# Patient Record
Sex: Female | Born: 1935 | Race: White | Hispanic: No | Smoking: Former smoker
Health system: Southern US, Community
[De-identification: ages and names within clinical notes are randomized; demographics above are authoritative.]

## PROBLEM LIST (undated history)

## (undated) DIAGNOSIS — E785 Hyperlipidemia, unspecified: Secondary | ICD-10-CM

## (undated) DIAGNOSIS — I10 Essential (primary) hypertension: Secondary | ICD-10-CM

## (undated) DIAGNOSIS — I7090 Unspecified atherosclerosis: Secondary | ICD-10-CM

## (undated) DIAGNOSIS — F0390 Unspecified dementia without behavioral disturbance: Secondary | ICD-10-CM

## (undated) DIAGNOSIS — I2510 Atherosclerotic heart disease of native coronary artery without angina pectoris: Secondary | ICD-10-CM

## (undated) DIAGNOSIS — M1990 Unspecified osteoarthritis, unspecified site: Secondary | ICD-10-CM

## (undated) HISTORY — DX: Unspecified osteoarthritis, unspecified site: M19.90

## (undated) HISTORY — DX: Unspecified dementia, unspecified severity, without behavioral disturbance, psychotic disturbance, mood disturbance, and anxiety: F03.90

## (undated) HISTORY — DX: Hyperlipidemia, unspecified: E78.5

## (undated) HISTORY — PX: ABDOMINAL HYSTERECTOMY: SHX81

## (undated) HISTORY — DX: Essential (primary) hypertension: I10

## (undated) HISTORY — DX: Unspecified atherosclerosis: I70.90

## (undated) HISTORY — DX: Atherosclerotic heart disease of native coronary artery without angina pectoris: I25.10

---

## 2009-03-11 ENCOUNTER — Emergency Department (HOSPITAL_BASED_OUTPATIENT_CLINIC_OR_DEPARTMENT_OTHER): Admission: EM | Admit: 2009-03-11 | Discharge: 2009-03-11 | Payer: Self-pay | Admitting: Emergency Medicine

## 2011-06-19 LAB — HM MAMMOGRAPHY

## 2012-08-11 ENCOUNTER — Other Ambulatory Visit (HOSPITAL_BASED_OUTPATIENT_CLINIC_OR_DEPARTMENT_OTHER): Payer: Self-pay | Admitting: Family Medicine

## 2012-08-13 ENCOUNTER — Ambulatory Visit (HOSPITAL_BASED_OUTPATIENT_CLINIC_OR_DEPARTMENT_OTHER)
Admission: RE | Admit: 2012-08-13 | Discharge: 2012-08-13 | Disposition: A | Discharge status: Home / Self Care | Payer: Medicare Other | Source: Ambulatory Visit | Attending: Family Medicine | Admitting: Family Medicine

## 2012-08-13 DIAGNOSIS — I7090 Unspecified atherosclerosis: Secondary | ICD-10-CM

## 2012-08-13 DIAGNOSIS — I6529 Occlusion and stenosis of unspecified carotid artery: Secondary | ICD-10-CM | POA: Insufficient documentation

## 2013-01-05 ENCOUNTER — Other Ambulatory Visit: Payer: Self-pay | Admitting: *Deleted

## 2013-01-05 DIAGNOSIS — E785 Hyperlipidemia, unspecified: Secondary | ICD-10-CM

## 2013-01-05 DIAGNOSIS — E559 Vitamin D deficiency, unspecified: Secondary | ICD-10-CM

## 2013-01-05 DIAGNOSIS — R5383 Other fatigue: Secondary | ICD-10-CM

## 2013-01-06 ENCOUNTER — Other Ambulatory Visit: Payer: Self-pay

## 2013-01-06 LAB — COMPLETE METABOLIC PANEL WITH GFR
ALT: 18 U/L (ref 0–35)
AST: 24 U/L (ref 0–37)
Albumin: 4.4 g/dL (ref 3.5–5.2)
Alkaline Phosphatase: 62 U/L (ref 39–117)
BUN: 15 mg/dL (ref 6–23)
CO2: 28 mEq/L (ref 19–32)
Calcium: 9.8 mg/dL (ref 8.4–10.5)
Chloride: 107 mEq/L (ref 96–112)
Creat: 0.84 mg/dL (ref 0.50–1.10)
GFR, Est African American: 76 mL/min
GFR, Est Non African American: 66 mL/min
Glucose, Bld: 100 mg/dL — ABNORMAL HIGH (ref 70–99)
Potassium: 4.2 mEq/L (ref 3.5–5.3)
Sodium: 143 mEq/L (ref 135–145)
Total Bilirubin: 0.6 mg/dL (ref 0.3–1.2)
Total Protein: 7 g/dL (ref 6.0–8.3)

## 2013-01-06 LAB — CBC WITH DIFFERENTIAL/PLATELET
Basophils Absolute: 0 10*3/uL (ref 0.0–0.1)
Basophils Relative: 0 % (ref 0–1)
Eosinophils Absolute: 0.1 10*3/uL (ref 0.0–0.7)
Eosinophils Relative: 2 % (ref 0–5)
HCT: 40.2 % (ref 36.0–46.0)
Hemoglobin: 13.7 g/dL (ref 12.0–15.0)
Lymphocytes Relative: 18 % (ref 12–46)
Lymphs Abs: 1.1 10*3/uL (ref 0.7–4.0)
MCH: 31.9 pg (ref 26.0–34.0)
MCHC: 34.1 g/dL (ref 30.0–36.0)
MCV: 93.5 fL (ref 78.0–100.0)
Monocytes Absolute: 0.3 10*3/uL (ref 0.1–1.0)
Monocytes Relative: 5 % (ref 3–12)
Neutro Abs: 4.6 10*3/uL (ref 1.7–7.7)
Neutrophils Relative %: 75 % (ref 43–77)
Platelets: 233 10*3/uL (ref 150–400)
RBC: 4.3 MIL/uL (ref 3.87–5.11)
RDW: 13.5 % (ref 11.5–15.5)
WBC: 6.1 10*3/uL (ref 4.0–10.5)

## 2013-01-06 LAB — LIPID PANEL
Cholesterol: 153 mg/dL (ref 0–200)
HDL: 68 mg/dL (ref >39)
LDL Cholesterol: 65 mg/dL (ref 0–99)
Total CHOL/HDL Ratio: 2.3 Ratio
Triglycerides: 102 mg/dL (ref <150)
VLDL: 20 mg/dL (ref 0–40)

## 2013-01-06 LAB — TSH: TSH: 1.298 u[IU]/mL (ref 0.350–4.500)

## 2013-01-07 LAB — VITAMIN D 25 HYDROXY (VIT D DEFICIENCY, FRACTURES): Vit D, 25-Hydroxy: 43 ng/mL (ref 30–89)

## 2013-01-29 ENCOUNTER — Ambulatory Visit: Payer: Self-pay | Admitting: Family Medicine

## 2013-01-29 ENCOUNTER — Encounter: Payer: Self-pay | Admitting: Family Medicine

## 2013-01-29 ENCOUNTER — Ambulatory Visit (INDEPENDENT_AMBULATORY_CARE_PROVIDER_SITE_OTHER): Payer: Medicare Other | Admitting: Family Medicine

## 2013-01-29 VITALS — BP 137/73 | HR 58 | Resp 16 | Ht 58.5 in | Wt 111.0 lb

## 2013-01-29 DIAGNOSIS — E785 Hyperlipidemia, unspecified: Secondary | ICD-10-CM

## 2013-01-29 DIAGNOSIS — I10 Essential (primary) hypertension: Secondary | ICD-10-CM

## 2013-01-29 DIAGNOSIS — R413 Other amnesia: Secondary | ICD-10-CM

## 2013-01-29 DIAGNOSIS — G4700 Insomnia, unspecified: Secondary | ICD-10-CM

## 2013-01-29 MED ORDER — ROSUVASTATIN CALCIUM 20 MG PO TABS: 20.0000 mg | ORAL_TABLET | Freq: Every day | ORAL | Status: DC

## 2013-01-29 MED ORDER — TRAZODONE HCL 50 MG PO TABS: ORAL_TABLET | ORAL | Status: DC

## 2013-01-29 NOTE — Progress Notes (Signed)
  Subjective:    Patient ID: Alexandra Lawson, female    DOB: Sep 16, 1930, 79 y.o.   MRN: [id number]  HPI  Alexandra Lawson is here today with her daughter Vic Blackbird)  to go over her most recent lab results and discuss the condition listed below:   1)  Hyperlipidemia:  She is doing fine on Crestor and needs to have it refilled.   2)  Memory:  Her daughter is becoming more concerned with her memory.     Review of Systems  Constitutional: Negative.   HENT: Negative.   Eyes: Negative.   Respiratory: Negative.   Cardiovascular: Negative.   Gastrointestinal: Negative.   Endocrine: Negative.   Genitourinary: Negative.   Allergic/Immunologic: Negative.   Neurological: Negative.   Hematological: Negative.   Psychiatric/Behavioral: Negative.     Past Medical History  Diagnosis Date  . Generalized and unspecified atherosclerosis   . Hyperlipidemia   . Hypertension   . Arthritis   . CAD (coronary artery disease)     Family History  Problem Relation Age of Onset  . Cancer Mother     Ovarian Cancer  . Heart disease Father     History   Social History Narrative   Marital Status:  Widowed    Children:  Daughter Technical sales engineer)    Pets:  Dog (1)    Living Situation: Lives alone    Occupation:  Retired    Education: 8 th grade    Tobacco Use/Exposure:  Former Smoker.  She smoked about 3 cigarettes per day for 40 years.  She quit 20 years ago.       Alcohol Use:  Never    Drug Use:  None   Diet:  Regular   Exercise:  She keeps herself busy with her dog and she takes care of her own yard.     Hobbies:  Watching TV                        Objective:   Physical Exam  Vitals reviewed. Constitutional: She is oriented to person, place, and time.  Eyes: Conjunctivae are normal. No scleral icterus.  Neck: Neck supple. No thyromegaly present.  Cardiovascular: Normal rate, regular rhythm and normal heart sounds.   Pulmonary/Chest: Effort normal and breath sounds normal.   Musculoskeletal: She exhibits no edema and no tenderness.  Lymphadenopathy:    She has no cervical adenopathy.  Neurological: She is alert and oriented to person, place, and time.  Skin: Skin is warm and dry.  Psychiatric: She has a normal mood and affect. Her behavior is normal.  She appears to have some dementia.      Assessment & Plan:

## 2013-01-29 NOTE — Patient Instructions (Addendum)
1) Cholesterol - Your level was perfect on the 10 mg so .Marland Kitchen..let's have you stay on this dosge.  You will take 1/2 of the 20 mg per day.  We'll recheck your labs in 6 months.  We'll recheck your carotid doppler early March of next year.    2)  Insomnia - Take the trazodone as needed.  3)  Blood Pressure - Continue on the metoprolol.   Dementia Dementia is a general term for problems with brain function. A person with dementia has memory loss and a hard time with at least one other brain function such as thinking, speaking, or problem solving. Dementia can affect social functioning, how you do your job, your mood, or your personality. The changes may be hidden for a long time. The earliest forms of this disease are usually not detected by family or friends. Dementia can be:  Irreversible.  Potentially reversible.  Partially reversible.  Progressive. This means it can get worse over time. CAUSES  Irreversible dementia causes may include:  Degeneration of brain cells (Alzheimer's disease or lewy body dementia).  Multiple small strokes (vascular dementia).  Infection (chronic meningitis or Creutzfelt-Jakob disease).  Frontotemporal dementia. This affects younger people, age 40 to 70, compared to those who have Alzheimer's disease.  Dementia associated with other disorders like Parkinson's disease, Huntington's disease, or HIV-associated dementia. Potentially or partially reversible dementia causes may include:  Medicines.  Metabolic causes such as excessive alcohol intake, vitamin B12 deficiency, or thyroid disease.  Masses or pressure in the brain such as a tumor, blood clot, or hydrocephalus. SYMPTOMS  Symptoms are often hard to detect. Family members or coworkers may not notice them early in the disease process. Different people with dementia may have different symptoms. Symptoms can include:  A hard time with memory, especially recent memory. Long-term memory may not be  impaired.  Asking the same question multiple times or forgetting something someone just said.  A hard time speaking your thoughts or finding certain words.  A hard time solving problems or performing familiar tasks (such as how to use a telephone).  Sudden changes in mood.  Changes in personality, especially increasing moodiness or mistrust.  Depression.  A hard time understanding complex ideas that were never a problem in the past. DIAGNOSIS  There are no specific tests for dementia.   Your caregiver may recommend a thorough evaluation. This is because some forms of dementia can be reversible. The evaluation will likely include a physical exam and getting a detailed history from you and a family member. The history often gives the best clues and suggestions for a diagnosis.  Memory testing may be done. A detailed brain function evaluation called neuropsychologic testing may be helpful.  Lab tests and brain imaging (such as a CT scan or MRI scan) are sometimes important.  Sometimes observation and re-evaluation over time is very helpful. TREATMENT  Treatment depends on the cause.   If the problem is a vitamin deficiency, it may be helped or cured with supplements.  For dementias such as Alzheimer's disease, medicines are available to stabilize or slow the course of the disease. There are no cures for this type of dementia.  Your caregiver can help direct you to groups, organizations, and other caregivers to help with decisions in the care of you or your loved one. HOME CARE INSTRUCTIONS The care of individuals with dementia is varied and dependent upon the progression of the dementia. The following suggestions are intended for the person living with,  or caring for, the person with dementia.  Create a safe environment.  Remove the locks on bathroom doors to prevent the person from accidentally locking himself or herself in.  Use childproof latches on kitchen cabinets and any  place where cleaning supplies, chemicals, or alcohol are kept.  Use childproof covers in unused electrical outlets.  Install childproof devices to keep doors and windows secured.  Remove stove knobs or install safety knobs and an automatic shut-off on the stove.  Lower the temperature on water heaters.  Label medicines and keep them locked up.  Secure knives, lighters, matches, power tools, and guns, and keep these items out of reach.  Keep the house free from clutter. Remove rugs or anything that might contribute to a fall.  Remove objects that might break and hurt the person.  Make sure lighting is good, both inside and outside.  Install grab rails as needed.  Use a monitoring device to alert you to falls or other needs for help.  Reduce confusion.  Keep familiar objects and people around.  Use night lights or dim lights at night.  Label items or areas.  Use reminders, notes, or directions for daily activities or tasks.  Keep a simple, consistent routine for waking, meals, bathing, dressing, and bedtime.  Create a calm, quiet environment.  Place large clocks and calendars prominently.  Display emergency numbers and home address near all telephones.  Use cues to establish different times of the day. An example is to open curtains to let the natural light in during the day.   Use effective communication.  Choose simple words and short sentences.  Use a gentle, calm tone of voice.  Be careful not to interrupt.  If the person is struggling to find a word or communicate a thought, try to provide the word or thought.  Ask one question at a time. Allow the person ample time to answer questions. Repeat the question again if the person does not respond.  Reduce nighttime restlessness.  Provide a comfortable bed.  Have a consistent nighttime routine.  Ensure a regular walking or physical activity schedule. Involve the person in daily activities as much as  possible.  Limit napping during the day.  Limit caffeine.  Attend social events that stimulate rather than overwhelm the senses.  Encourage good nutrition and hydration.  Reduce distractions during meal times and snacks.  Avoid foods that are too hot or too cold.  Monitor chewing and swallowing ability.  Continue with routine vision, hearing, dental, and medical screenings.  Only give over-the-counter or prescription medicines as directed by the caregiver.  Monitor driving abilities. Do not allow the person to drive when safe driving is no longer possible.  Register with an identification program which could provide location assistance in the event of a missing person situation. SEEK MEDICAL CARE IF:   New behavioral problems start such as moodiness, aggressiveness, or seeing things that are not there (hallucinations).  Any new problem with brain function happens. This includes problems with balance, speech, or falling a lot.  Problems with swallowing develop.  Any symptoms of other illness happen. Small changes or worsening in any aspect of brain function can be a sign that the illness is getting worse. It can also be a sign of another medical illness such as infection. Seeing a caregiver right away is important. SEEK IMMEDIATE MEDICAL CARE IF:   A fever develops.  New or worsened confusion develops.  New or worsened sleepiness develops.  Staying awake  becomes hard to do. Document Released: 11/28/2000 Document Revised: 08/27/2011 Document Reviewed: 10/30/2010 [facility] Patient Information 2014 [place], [place].

## 2013-03-04 ENCOUNTER — Ambulatory Visit: Payer: Medicare Other

## 2013-03-11 ENCOUNTER — Ambulatory Visit (INDEPENDENT_AMBULATORY_CARE_PROVIDER_SITE_OTHER): Payer: Medicare Other | Admitting: *Deleted

## 2013-03-11 DIAGNOSIS — Z23 Encounter for immunization: Secondary | ICD-10-CM

## 2013-03-15 ENCOUNTER — Encounter: Payer: Self-pay | Admitting: Family Medicine

## 2013-03-15 DIAGNOSIS — F0390 Unspecified dementia without behavioral disturbance: Secondary | ICD-10-CM | POA: Insufficient documentation

## 2013-03-15 DIAGNOSIS — G4700 Insomnia, unspecified: Secondary | ICD-10-CM | POA: Insufficient documentation

## 2013-03-15 DIAGNOSIS — E785 Hyperlipidemia, unspecified: Secondary | ICD-10-CM | POA: Insufficient documentation

## 2013-03-15 DIAGNOSIS — I10 Essential (primary) hypertension: Secondary | ICD-10-CM | POA: Insufficient documentation

## 2013-03-15 DIAGNOSIS — R413 Other amnesia: Secondary | ICD-10-CM | POA: Insufficient documentation

## 2013-03-15 NOTE — Assessment & Plan Note (Deleted)
Alexandra Lawson has made an appointment for [facility] with a neurologist to evaluate her for dementia.

## 2013-03-15 NOTE — Assessment & Plan Note (Signed)
She will remain on Trazodone.

## 2013-03-15 NOTE — Assessment & Plan Note (Signed)
[name] has made an appointment for Alexandra Lawson with a neurologist to evaluate her for dementia.   

## 2013-03-15 NOTE — Assessment & Plan Note (Signed)
She will remain on Toprol XL 25 mg.

## 2013-03-15 NOTE — Assessment & Plan Note (Signed)
She is to take 10 mg of Crestor daily (1/2 of the 20 mg).

## 2013-03-15 NOTE — Assessment & Plan Note (Deleted)
[name] has made an appointment for Alexandra Lawson with a neurologist to evaluate her for dementia.   

## 2013-04-10 ENCOUNTER — Other Ambulatory Visit: Payer: Self-pay | Admitting: Family Medicine

## 2013-05-21 ENCOUNTER — Encounter: Payer: Self-pay | Admitting: *Deleted

## 2013-08-03 ENCOUNTER — Other Ambulatory Visit: Payer: Self-pay | Admitting: *Deleted

## 2013-08-03 ENCOUNTER — Other Ambulatory Visit: Payer: Medicare Other

## 2013-08-03 DIAGNOSIS — E785 Hyperlipidemia, unspecified: Secondary | ICD-10-CM

## 2013-08-03 LAB — COMPLETE METABOLIC PANEL WITH GFR
ALT: 19 U/L (ref 0–35)
AST: 23 U/L (ref 0–37)
Albumin: 4.3 g/dL (ref 3.5–5.2)
Alkaline Phosphatase: 68 U/L (ref 39–117)
BUN: 17 mg/dL (ref 6–23)
CO2: 31 mEq/L (ref 19–32)
Calcium: 9.6 mg/dL (ref 8.4–10.5)
Chloride: 106 mEq/L (ref 96–112)
Creat: 0.88 mg/dL (ref 0.50–1.10)
GFR, Est African American: 72 mL/min
GFR, Est Non African American: 62 mL/min
Glucose, Bld: 98 mg/dL (ref 70–99)
Potassium: 4.7 mEq/L (ref 3.5–5.3)
Sodium: 143 mEq/L (ref 135–145)
Total Bilirubin: 0.5 mg/dL (ref 0.2–1.2)
Total Protein: 6.7 g/dL (ref 6.0–8.3)

## 2013-08-03 LAB — LIPID PANEL
Cholesterol: 141 mg/dL (ref 0–200)
HDL: 71 mg/dL
LDL Cholesterol: 56 mg/dL (ref 0–99)
Total CHOL/HDL Ratio: 2 ratio
Triglycerides: 71 mg/dL
VLDL: 14 mg/dL (ref 0–40)

## 2013-08-08 ENCOUNTER — Other Ambulatory Visit: Payer: Self-pay | Admitting: Family Medicine

## 2013-08-10 ENCOUNTER — Encounter: Payer: Self-pay | Admitting: Family Medicine

## 2013-08-10 ENCOUNTER — Ambulatory Visit (INDEPENDENT_AMBULATORY_CARE_PROVIDER_SITE_OTHER): Payer: Medicare Other | Admitting: Family Medicine

## 2013-08-10 VITALS — BP 153/68 | HR 59 | Resp 16 | Ht 59.0 in | Wt 117.0 lb

## 2013-08-10 DIAGNOSIS — R51 Headache: Secondary | ICD-10-CM

## 2013-08-10 DIAGNOSIS — E785 Hyperlipidemia, unspecified: Secondary | ICD-10-CM

## 2013-08-10 DIAGNOSIS — I10 Essential (primary) hypertension: Secondary | ICD-10-CM

## 2013-08-10 DIAGNOSIS — G4700 Insomnia, unspecified: Secondary | ICD-10-CM

## 2013-08-10 DIAGNOSIS — E559 Vitamin D deficiency, unspecified: Secondary | ICD-10-CM

## 2013-08-10 DIAGNOSIS — I7090 Unspecified atherosclerosis: Secondary | ICD-10-CM

## 2013-08-10 MED ORDER — AMLODIPINE BESYLATE 2.5 MG PO TABS: 2.5000 mg | ORAL_TABLET | ORAL | Status: AC

## 2013-08-10 MED ORDER — ROSUVASTATIN CALCIUM 20 MG PO TABS: 20.0000 mg | ORAL_TABLET | Freq: Every day | ORAL | Status: AC

## 2013-08-10 MED ORDER — TRAZODONE HCL 50 MG PO TABS: ORAL_TABLET | ORAL | Status: AC

## 2013-08-10 MED ORDER — METOPROLOL SUCCINATE ER 25 MG PO TB24: 25.0000 mg | ORAL_TABLET | Freq: Every day | ORAL | Status: AC

## 2013-08-10 MED ORDER — VITAMIN D (ERGOCALCIFEROL) 1.25 MG (50000 UNIT) PO CAPS: ORAL_CAPSULE | ORAL | Status: AC

## 2013-08-10 NOTE — Progress Notes (Signed)
Subjective:    Patient ID: Alexandra Lawson, female    DOB: 26-Dec-1930, 80 y.o.   MRN: [id number]  HPI  Alexandra Lawson is here today with her daughter Alexandra Quin([name]) to follow up on her medications and to go over her recent lab results.    1)  Hypertension:  She is doing well on the Metoprolol.  2)  Hyperlipidemia:  She is taking Crestor and her cholesterol is perfect.   3)  Dementia:  Dr. Windle GuardHaworth has started her on both  Namenda and Aricept. Alexandra Lawson feels that it has helped her memory.    4)  Sleep Disturbance:  She takes Trazodone as needed.  5)  Headaches:  She has been having increased headaches recently.  She thought that they might be due to her BP but she has been monitoring it and it has been perfect.  She also wondered if they could be related to her eyes but she had an eye exam and her eye doctor did not feel that her headaches were related to her vision.     Review of Systems  Constitutional: Negative for activity change, appetite change and unexpected weight change.  Cardiovascular: Negative for chest pain, palpitations and leg swelling.  Neurological: Positive for headaches.  Psychiatric/Behavioral: Negative for sleep disturbance.  All other systems reviewed and are negative.     Past Medical History  Diagnosis Date  . Generalized and unspecified atherosclerosis   . Hyperlipidemia   . Hypertension   . Arthritis   . CAD (coronary artery disease)   . Dementia      Past Surgical History  Procedure Laterality Date  . Abdominal hysterectomy       History   Social History Narrative   Marital Status:  Widowed    Children:  Daughter Technical sales engineer([name] Shawver)    Pets:  Dog (1)    Living Situation: Lives alone    Occupation:  Retired    Education: 8 th grade    Tobacco Use/Exposure:  Former Smoker.  She smoked about 3 cigarettes per day for 40 years.  She quit 20 years ago.       Alcohol Use:  Never    Drug Use:  None   Diet:  Regular   Exercise:  She keeps herself busy with  her dog and she takes care of her own yard.     Hobbies:  Watching TV                      Family History  Problem Relation Age of Onset  . Cancer Mother     Ovarian Cancer  . Heart disease Father   . Kidney disease Sister   . Hypertension Brother      No Known Allergies   Immunization History  Administered Date(s) Administered  . Influenza,inj,Quad PF,36+ Mos 03/11/2013  . Zoster 08/11/2012, 08/11/2012       Objective:   Physical Exam  Nursing note and vitals reviewed. Constitutional: She is oriented to person, place, and time. She appears well-nourished.  Eyes: Conjunctivae are normal. No scleral icterus.  Neck: Neck supple. No thyromegaly present.  Cardiovascular: Normal rate, regular rhythm and normal heart sounds.   Pulmonary/Chest: Effort normal and breath sounds normal.  Musculoskeletal: She exhibits no edema and no tenderness.  Lymphadenopathy:    She has no cervical adenopathy.  Neurological: She is alert and oriented to person, place, and time.  Skin: Skin is warm and dry.  Psychiatric: She has a normal  mood and affect. Her behavior is normal.  She appears to have some dementia.      Assessment & Plan:    Corrie Dandy was seen today for medication management.  Diagnoses and associated orders for this visit:  Essential hypertension, benign - metoprolol succinate (TOPROL XL) 25 MG 24 hr tablet; Take 1 tablet (25 mg total) by mouth at bedtime. - amLODipine (NORVASC) 2.5 MG tablet; Take 1 tablet (2.5 mg total) by mouth every morning.  Other and unspecified hyperlipidemia - rosuvastatin (CRESTOR) 20 MG tablet; Take 1 tablet (20 mg total) by mouth daily.  Headache(784.0)  Unspecified vitamin D deficiency - Vitamin D, Ergocalciferol, (DRISDOL) 50000 UNITS CAPS capsule; Take 1 capsule po weekly  Insomnia - traZODone (DESYREL) 50 MG tablet; Take 1 tab po at bedtime as needed for sleep.  Atherosclerosis - US Carotid Duplex Bilateral   TIME SPENT "FACE  TO FACE" WITH PATIENT -  30 MINS

## 2013-08-24 ENCOUNTER — Ambulatory Visit (HOSPITAL_BASED_OUTPATIENT_CLINIC_OR_DEPARTMENT_OTHER)
Admission: RE | Admit: 2013-08-24 | Discharge: 2013-08-24 | Disposition: A | Discharge status: Home / Self Care | Payer: Medicare Other | Source: Ambulatory Visit | Attending: Family Medicine | Admitting: Family Medicine

## 2013-08-24 DIAGNOSIS — I2510 Atherosclerotic heart disease of native coronary artery without angina pectoris: Secondary | ICD-10-CM | POA: Insufficient documentation

## 2013-08-24 DIAGNOSIS — I10 Essential (primary) hypertension: Secondary | ICD-10-CM | POA: Insufficient documentation

## 2013-12-31 ENCOUNTER — Other Ambulatory Visit: Payer: Self-pay | Admitting: Family Medicine

## 2014-05-27 ENCOUNTER — Ambulatory Visit: Payer: Medicare Other | Admitting: Family Medicine

## 2014-07-11 IMAGING — US US CAROTID DUPLEX BILAT
1 series · 13 of 24 positions shown · non-contrast
Comparison: 08/13/2012

CLINICAL DATA: Hypertension, coronary artery disease

EXAM:
BILATERAL CAROTID DUPLEX ULTRASOUND
TECHNIQUE: Gray scale imaging, color Doppler and duplex ultrasound was
performed of bilateral carotid and vertebral arteries in the neck.

[Series 1: us carotid duplex bilat · 0.08mm/px · 13 of 73 slices shown]
[im 1/73]
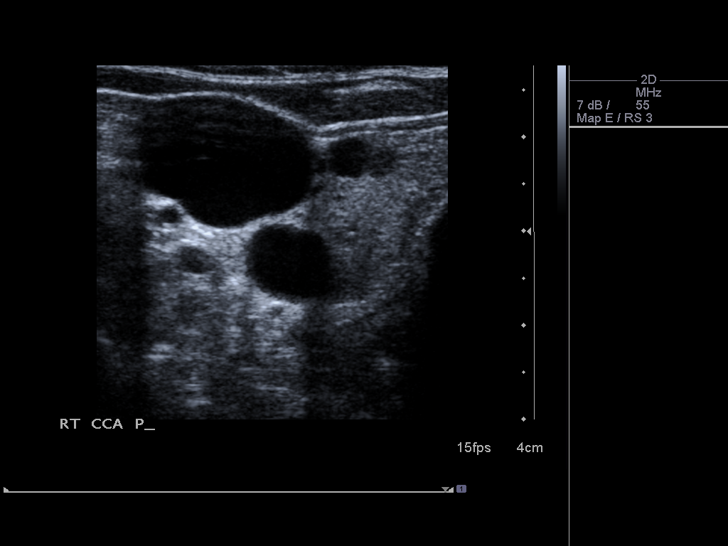
[im 7/73]
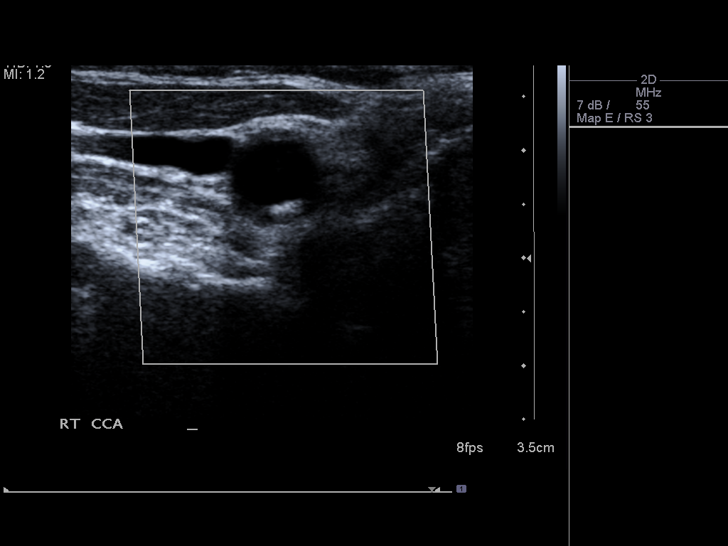
[im 13/73]
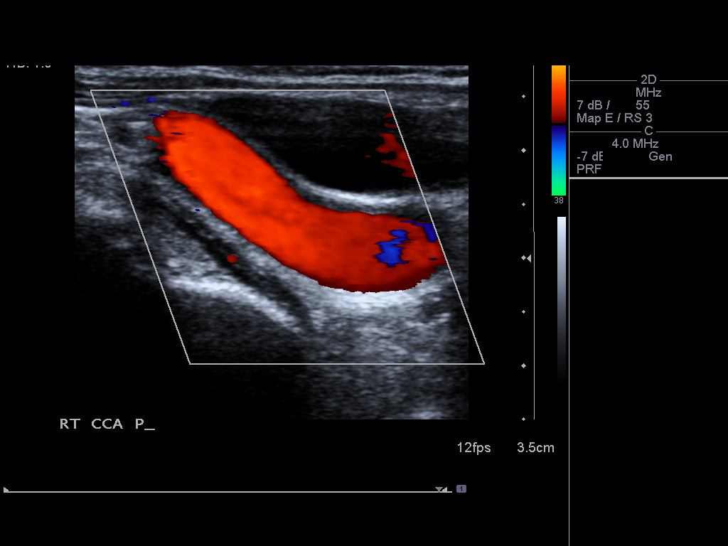
[im 19/73]
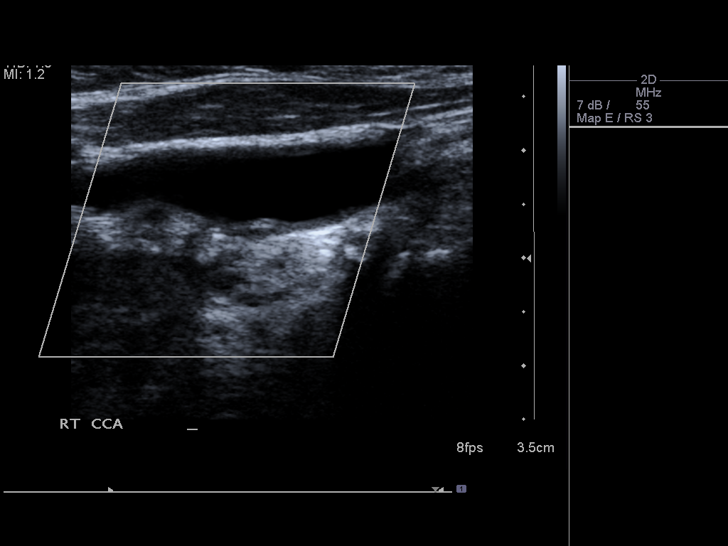
[im 26/73]
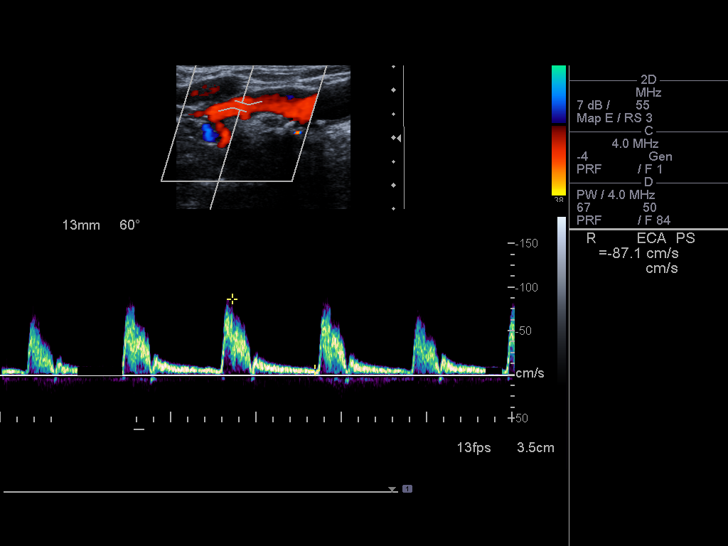
[im 32/73]
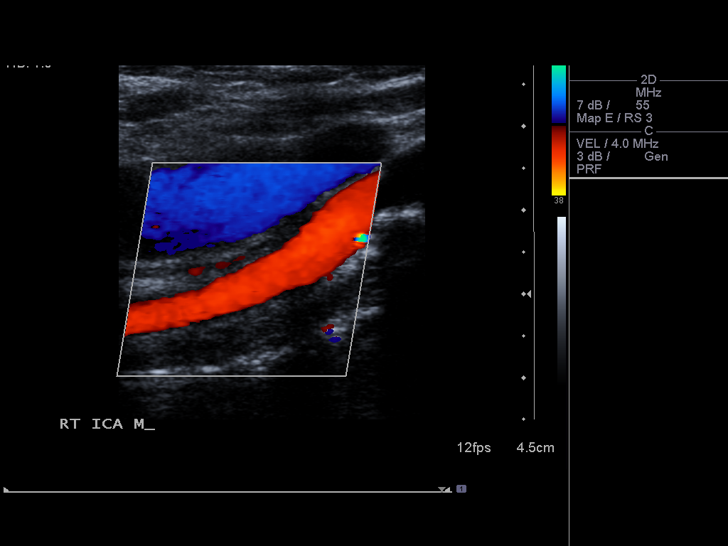
[im 38/73]
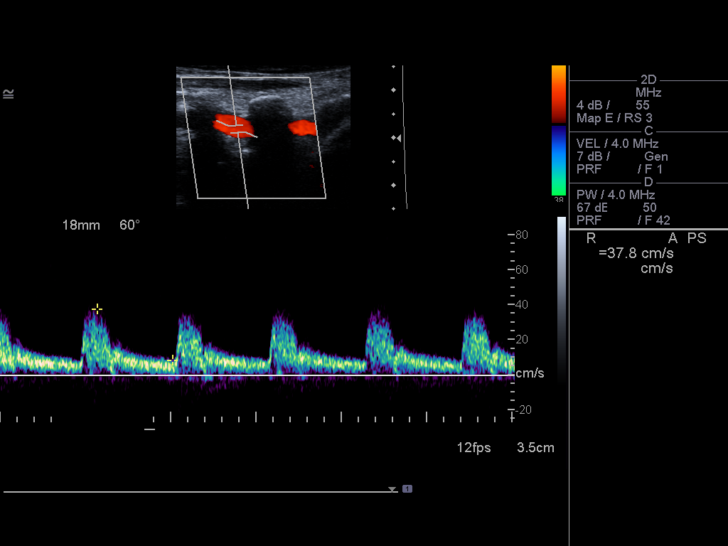
[im 41/73]
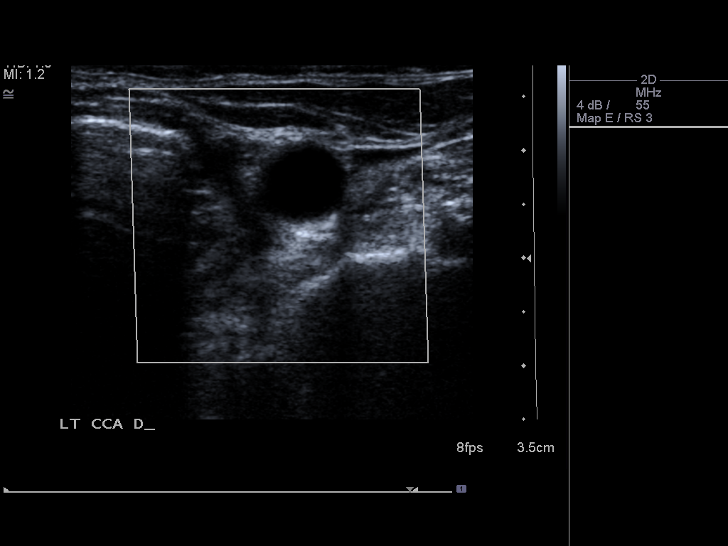
[im 47/73]
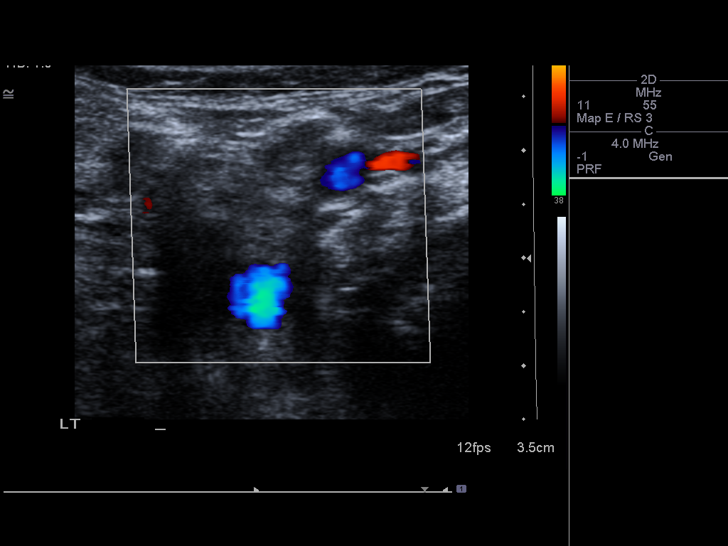
[im 54/73]
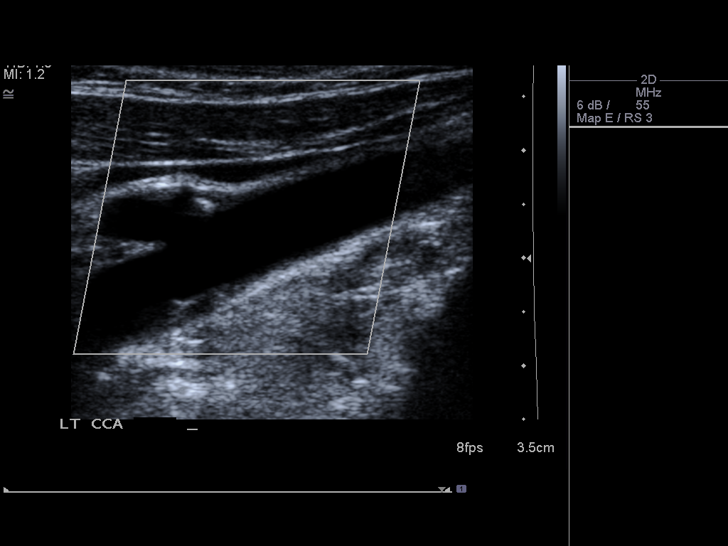
[im 60/73]
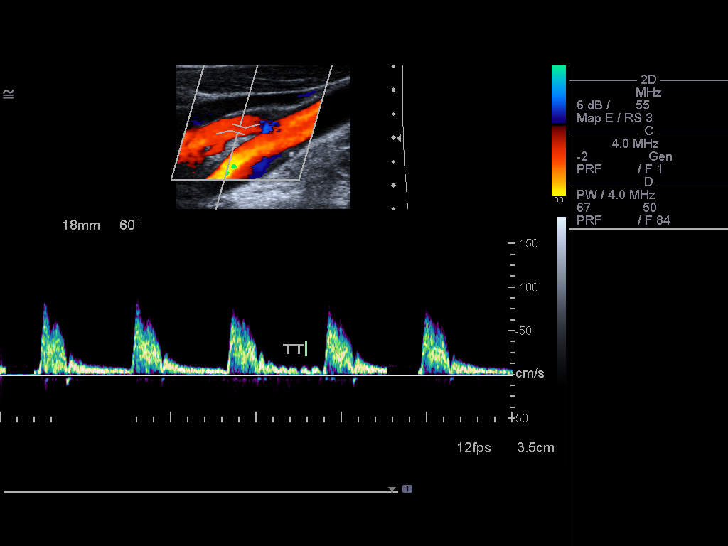
[im 66/73]
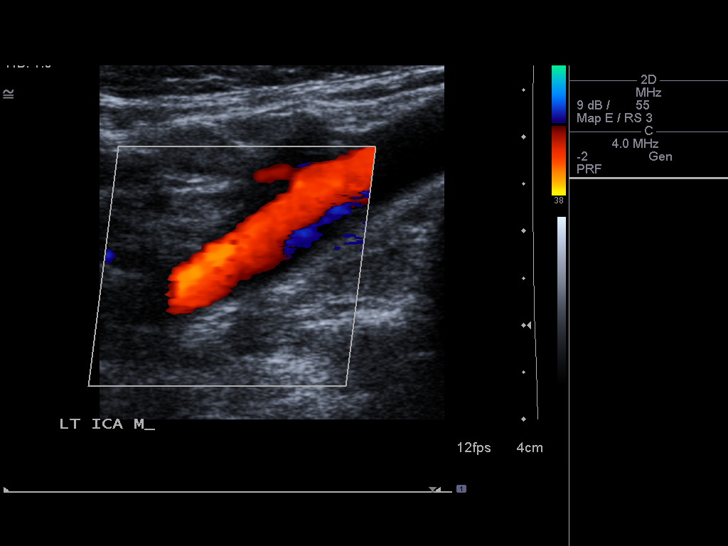
[im 73/73]
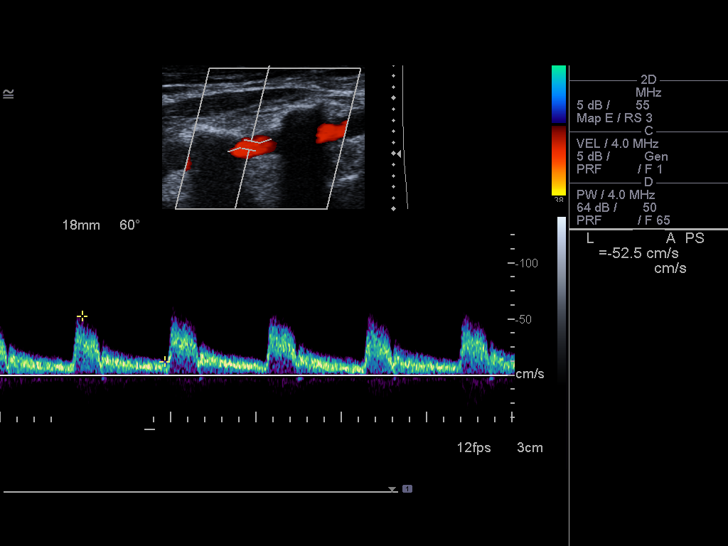

[13 of 24 positions shown; findings below may reference images not displayed]

REVIEW OF SYSTEMS:
Quantification of carotid stenosis is based on velocity parameters
that correlate the residual internal carotid diameter with
NASCET-based stenosis levels, using the diameter of the distal
internal carotid lumen as the denominator for stenosis measurement.

The following velocity measurements were obtained:

PEAK SYSTOLIC/END DIASTOLIC

RIGHT

ICA:                     90/24cm/sec

CCA:                     102/21cm/sec

SYSTOLIC ICA/CCA RATIO:

DIASTOLIC ICA/CCA RATIO:

ECA:                     87cm/sec

LEFT

ICA:                     133/30cm/sec

CCA:                     112/20cm/sec

SYSTOLIC ICA/CCA RATIO:

DIASTOLIC ICA/CCA RATIO:

ECA:                     95cm/sec
FINDINGS: RIGHT CAROTID ARTERY: Eccentric partially calcified plaque in the
mid common carotid artery without significant stenosis. There is
eccentric partially calcified plaque in the carotid bulb extending
into the proximal ICA resulting in at least mild stenosis. Normal
waveforms and color Doppler signal however.

RIGHT VERTEBRAL ARTERY:  Normal flow direction and waveform.

LEFT CAROTID ARTERY: Eccentric partially calcified plaque in the
carotid bulb extending into proximal internal and external carotid
arteries without high-grade stenosis. Normal waveforms and color
Doppler signal. Distal ICA tortuous.

LEFT VERTEBRAL ARTERY: Normal flow direction and waveform.
IMPRESSION: Bilateral carotid bifurcation and proximal ICA plaque, resulting in
less than 50% diameter stenosis. The exam does not exclude plaque
ulceration or embolization. Continued surveillance recommended.

## 2022-03-18 ENCOUNTER — Encounter (HOSPITAL_COMMUNITY): Payer: Self-pay

## 2022-03-18 ENCOUNTER — Encounter (HOSPITAL_BASED_OUTPATIENT_CLINIC_OR_DEPARTMENT_OTHER): Payer: Self-pay | Admitting: Emergency Medicine

## 2022-03-18 ENCOUNTER — Emergency Department (HOSPITAL_BASED_OUTPATIENT_CLINIC_OR_DEPARTMENT_OTHER)
Admission: EM | Admit: 2022-03-18 | Discharge: 2022-03-19 | Disposition: A | Discharge status: Home / Self Care | Payer: Medicare Other | Attending: Student | Admitting: Student

## 2022-03-18 ENCOUNTER — Other Ambulatory Visit: Payer: Self-pay

## 2022-03-18 ENCOUNTER — Emergency Department (HOSPITAL_BASED_OUTPATIENT_CLINIC_OR_DEPARTMENT_OTHER): Payer: Medicare Other

## 2022-03-18 DIAGNOSIS — J40 Bronchitis, not specified as acute or chronic: Secondary | ICD-10-CM

## 2022-03-18 DIAGNOSIS — I10 Essential (primary) hypertension: Secondary | ICD-10-CM | POA: Diagnosis not present

## 2022-03-18 DIAGNOSIS — R059 Cough, unspecified: Secondary | ICD-10-CM | POA: Insufficient documentation

## 2022-03-18 DIAGNOSIS — J449 Chronic obstructive pulmonary disease, unspecified: Secondary | ICD-10-CM | POA: Insufficient documentation

## 2022-03-18 DIAGNOSIS — R0602 Shortness of breath: Secondary | ICD-10-CM | POA: Diagnosis present

## 2022-03-18 DIAGNOSIS — Z87891 Personal history of nicotine dependence: Secondary | ICD-10-CM | POA: Diagnosis not present

## 2022-03-18 DIAGNOSIS — F0390 Unspecified dementia without behavioral disturbance: Secondary | ICD-10-CM | POA: Insufficient documentation

## 2022-03-18 DIAGNOSIS — I2510 Atherosclerotic heart disease of native coronary artery without angina pectoris: Secondary | ICD-10-CM | POA: Insufficient documentation

## 2022-03-18 DIAGNOSIS — D72829 Elevated white blood cell count, unspecified: Secondary | ICD-10-CM | POA: Diagnosis not present

## 2022-03-18 DIAGNOSIS — J441 Chronic obstructive pulmonary disease with (acute) exacerbation: Secondary | ICD-10-CM | POA: Diagnosis present

## 2022-03-18 DIAGNOSIS — J069 Acute upper respiratory infection, unspecified: Secondary | ICD-10-CM

## 2022-03-18 LAB — CBC WITH DIFFERENTIAL/PLATELET
Abs Immature Granulocytes: 0.03 10*3/uL (ref 0.00–0.07)
Basophils Absolute: 0 10*3/uL (ref 0.0–0.1)
Basophils Relative: 0 %
Eosinophils Absolute: 0 10*3/uL (ref 0.0–0.5)
Eosinophils Relative: 0 %
HCT: 38.1 % (ref 36.0–46.0)
Hemoglobin: 12.6 g/dL (ref 12.0–15.0)
Immature Granulocytes: 0 %
Lymphocytes Relative: 12 %
Lymphs Abs: 1.3 10*3/uL (ref 0.7–4.0)
MCH: 31.6 pg (ref 26.0–34.0)
MCHC: 33.1 g/dL (ref 30.0–36.0)
MCV: 95.5 fL (ref 80.0–100.0)
Monocytes Absolute: 0.8 10*3/uL (ref 0.1–1.0)
Monocytes Relative: 7 %
Neutro Abs: 8.6 10*3/uL — ABNORMAL HIGH (ref 1.7–7.7)
Neutrophils Relative %: 81 %
Platelets: 207 10*3/uL (ref 150–400)
RBC: 3.99 MIL/uL (ref 3.87–5.11)
RDW: 13.4 % (ref 11.5–15.5)
WBC: 10.8 10*3/uL — ABNORMAL HIGH (ref 4.0–10.5)
nRBC: 0 % (ref 0.0–0.2)

## 2022-03-18 LAB — COMPREHENSIVE METABOLIC PANEL
ALT: 16 U/L (ref 0–44)
AST: 24 U/L (ref 15–41)
Albumin: 3.6 g/dL (ref 3.5–5.0)
Alkaline Phosphatase: 69 U/L (ref 38–126)
Anion gap: 8 (ref 5–15)
BUN: 14 mg/dL (ref 8–23)
CO2: 25 mmol/L (ref 22–32)
Calcium: 8.7 mg/dL — ABNORMAL LOW (ref 8.9–10.3)
Chloride: 104 mmol/L (ref 98–111)
Creatinine, Ser: 0.79 mg/dL (ref 0.44–1.00)
GFR, Estimated: >60 mL/min (ref >60)
Glucose, Bld: 97 mg/dL (ref 70–99)
Potassium: 4 mmol/L (ref 3.5–5.1)
Sodium: 137 mmol/L (ref 135–145)
Total Bilirubin: 0.8 mg/dL (ref 0.3–1.2)
Total Protein: 7.3 g/dL (ref 6.5–8.1)

## 2022-03-18 LAB — I-STAT VENOUS BLOOD GAS, ED
Acid-Base Excess: 2 mmol/L (ref 0.0–2.0)
Bicarbonate: 26.4 mmol/L (ref 20.0–28.0)
Calcium, Ion: 1.16 mmol/L (ref 1.15–1.40)
HCT: 39 % (ref 36.0–46.0)
Hemoglobin: 13.3 g/dL (ref 12.0–15.0)
O2 Saturation: 91 %
Potassium: 4 mmol/L (ref 3.5–5.1)
Sodium: 138 mmol/L (ref 135–145)
TCO2: 28 mmol/L (ref 22–32)
pCO2, Ven: 38.4 mmHg — ABNORMAL LOW (ref 44–60)
pH, Ven: 7.446 — ABNORMAL HIGH (ref 7.25–7.43)
pO2, Ven: 59 mmHg — ABNORMAL HIGH (ref 32–45)

## 2022-03-18 LAB — TROPONIN I (HIGH SENSITIVITY)
Troponin I (High Sensitivity): 4 ng/L (ref <18)
Troponin I (High Sensitivity): 4 ng/L (ref <18)

## 2022-03-18 MED ORDER — AMLODIPINE BESYLATE 5 MG PO TABS
2.5000 mg | ORAL_TABLET | ORAL | Status: DC
  Administered 2022-03-19: 2.5 mg via ORAL
  Filled 2022-03-18: qty 1

## 2022-03-18 MED ORDER — ALBUTEROL SULFATE (2.5 MG/3ML) 0.083% IN NEBU
10.0000 mg | INHALATION_SOLUTION | Freq: Once | RESPIRATORY_TRACT | Status: AC
  Administered 2022-03-18: 10 mg via RESPIRATORY_TRACT
  Filled 2022-03-18: qty 12

## 2022-03-18 MED ORDER — MEMANTINE HCL ER 7 MG PO CP24
21.0000 mg | ORAL_CAPSULE | Freq: Every day | ORAL | Status: DC
  Administered 2022-03-18: 21 mg via ORAL
  Filled 2022-03-18: qty 1

## 2022-03-18 MED ORDER — METHYLPREDNISOLONE SODIUM SUCC 125 MG IJ SOLR
125.0000 mg | Freq: Once | INTRAMUSCULAR | Status: AC
  Administered 2022-03-18: 125 mg via INTRAVENOUS
  Filled 2022-03-18: qty 2

## 2022-03-18 MED ORDER — DONEPEZIL HCL 10 MG PO TABS
10.0000 mg | ORAL_TABLET | Freq: Every day | ORAL | Status: DC
  Administered 2022-03-18: 10 mg via ORAL
  Filled 2022-03-18: qty 1

## 2022-03-18 MED ORDER — TRAZODONE HCL 50 MG PO TABS
50.0000 mg | ORAL_TABLET | Freq: Every day | ORAL | Status: DC
  Administered 2022-03-18: 50 mg via ORAL
  Filled 2022-03-18: qty 1

## 2022-03-18 MED ORDER — IPRATROPIUM-ALBUTEROL 0.5-2.5 (3) MG/3ML IN SOLN
9.0000 mL | Freq: Once | RESPIRATORY_TRACT | Status: AC
  Administered 2022-03-18: 9 mL via RESPIRATORY_TRACT

## 2022-03-18 MED ORDER — ALBUTEROL (5 MG/ML) CONTINUOUS INHALATION SOLN: 10.0000 mg/h | INHALATION_SOLUTION | RESPIRATORY_TRACT | Status: DC

## 2022-03-18 NOTE — ED Triage Notes (Signed)
Pt family reports cough since Fri; cough sounds congested in triage; sent here from [facility]; NAD

## 2022-03-18 NOTE — ED Notes (Signed)
Provider at bedside

## 2022-03-18 NOTE — ED Provider Notes (Signed)
[facility] EMERGENCY DEPARTMENT Provider Note  CSN: [id number] Arrival date & time: 03/18/22 1616  Chief Complaint(s) Cough  HPI Alexandra Lawson is a 88 y.o. female with PMH dementia, CAD, HTN, HLD, COPD who presents emergency department for evaluation of a cough and shortness of breath.  History obtained from patient's daughter who states that symptoms have been progressively worsening for the last 2 to 3 days.  Associated with congestion.  Denies documented fever, abdominal pain, nausea, vomiting or other systemic symptoms.   Past Medical History Past Medical History:  Diagnosis Date   Arthritis    CAD (coronary artery disease)    Dementia (H[facility])    Generalized and unspecified atherosclerosis    Hyperlipidemia    Hypertension    Patient Active Problem List   Diagnosis Date Noted   Insomnia 03/15/2013   Other and unspecified hyperlipidemia 03/15/2013   Essential hypertension, benign 03/15/2013   Memory loss 03/15/2013   Home Medication(s) Prior to Admission medications   Medication Sig Start Date End Date Taking? Authorizing Provider  amLODipine (NORVASC) 2.5 MG tablet Take 1 tablet (2.5 mg total) by mouth every morning. 08/10/13 08/10/14  Zanard, Hinton Dyer, MD  donepezil (ARICEPT) 10 MG tablet Take 10 mg by mouth at bedtime.    [provider]  Memantine HCl ER (NAMENDA XR) 21 MG CP24 Take 1 capsule by mouth daily.    [provider]  rosuvastatin (CRESTOR) 20 MG tablet Take 1 tablet (20 mg total) by mouth daily. 08/10/13 08/10/14  Gillian Scarce, MD  traZODone (DESYREL) 50 MG tablet Take 1 tab po at bedtime as needed for sleep. 08/10/13 08/10/14  Gillian Scarce, MD                                                                                                                                    Past Surgical History Past Surgical History:  Procedure Laterality Date   ABDOMINAL HYSTERECTOMY     Family History Family History  Problem Relation Age of  Onset   Cancer Mother        Ovarian Cancer   Heart disease Father    Kidney disease Sister    Hypertension Brother     Social History Social History   Tobacco Use   Smoking status: Former    Packs/day: 0.25    Types: Cigarettes    Quit date: 06/18/1992    Years since quitting: 29.7   Smokeless tobacco: Never  Substance Use Topics   Alcohol use: No   Drug use: No   Allergies Patient has no known allergies.  Review of Systems Review of Systems  Respiratory:  Positive for cough, chest tightness and shortness of breath.     Physical Exam Vital Signs  I have reviewed the triage vital signs BP 104/65 (BP Location: Left Arm)   Pulse 77   Temp 98.5 F (36.9 C) (Oral)  Resp 18   Ht 4\' 8"  (1.422 m)   Wt 53.1 kg   SpO2 95%   BMI 26.23 kg/m   Physical Exam Vitals and nursing note reviewed.  Constitutional:      General: She is not in acute distress.    Appearance: She is well-developed.  HENT:     Head: Normocephalic and atraumatic.  Eyes:     Conjunctiva/sclera: Conjunctivae normal.  Cardiovascular:     Rate and Rhythm: Normal rate and regular rhythm.     Heart sounds: No murmur heard. Pulmonary:     Effort: Pulmonary effort is normal. No respiratory distress.     Breath sounds: Wheezing present.  Abdominal:     Palpations: Abdomen is soft.     Tenderness: There is no abdominal tenderness.  Musculoskeletal:        General: No swelling.     Cervical back: Neck supple.  Skin:    General: Skin is warm and dry.     Capillary Refill: Capillary refill takes less than 2 seconds.  Neurological:     Mental Status: She is alert.  Psychiatric:        Mood and Affect: Mood normal.     ED Results and Treatments Labs (all labs ordered are listed, but only abnormal results are displayed) Labs Reviewed - No data to display                                                                                                                        Radiology No results  found.  Pertinent labs & imaging results that were available during my care of the patient were reviewed by me and considered in my medical decision making (see MDM for details).  Medications Ordered in ED Medications - No data to display                                                                                                                                   Procedures Procedures  (including critical care time)  Medical Decision Making / ED Course   This patient presents to the ED for concern of ***, this involves an extensive number of treatment options, and is a complaint that carries with it a high risk of complications and morbidity.  The differential diagnosis includes ***  MDM: ***   Additional history obtained: -Additional history obtained from *** -External records from outside source  obtained and reviewed including: Chart review including previous notes, labs, imaging, consultation notes   Lab Tests: -I ordered, reviewed, and interpreted labs.   The pertinent results include:   Labs Reviewed - No data to display    EKG ***  EKG Interpretation  Date/Time:    Ventricular Rate:    PR Interval:    QRS Duration:   QT Interval:    QTC Calculation:   R Axis:     Text Interpretation:           Imaging Studies ordered: I ordered imaging studies including *** I independently visualized and interpreted imaging. I agree with the radiologist interpretation   Medicines ordered and prescription drug management: No orders of the defined types were placed in this encounter.   -I have reviewed the patients home medicines and have made adjustments as needed  Critical interventions ***  Consultations Obtained: I requested consultation with the ***,  and discussed lab and imaging findings as well as pertinent plan - they recommend: ***   Cardiac Monitoring: The patient was maintained on a cardiac monitor.  I personally viewed and interpreted the cardiac  monitored which showed an underlying rhythm of: ***  Social Determinants of Health:  Factors impacting patients care include: ***   Reevaluation: After the interventions noted above, I reevaluated the patient and found that they have :{resolved/improved/worsened:23923::"improved"}  Co morbidities that complicate the patient evaluation  Past Medical History:  Diagnosis Date   Arthritis    CAD (coronary artery disease)    Dementia ([place])    Generalized and unspecified atherosclerosis    Hyperlipidemia    Hypertension       Dispostion: I considered admission for this patient, ***     Final Clinical Impression(s) / ED Diagnoses Final diagnoses:  None     @PCDICTATION @

## 2022-03-19 ENCOUNTER — Encounter (HOSPITAL_BASED_OUTPATIENT_CLINIC_OR_DEPARTMENT_OTHER): Payer: Self-pay | Admitting: Internal Medicine

## 2022-03-19 DIAGNOSIS — R059 Cough, unspecified: Secondary | ICD-10-CM | POA: Diagnosis not present

## 2022-03-19 MED ORDER — AZITHROMYCIN 250 MG PO TABS: 250.0000 mg | ORAL_TABLET | Freq: Every day | ORAL | 0 refills | Status: AC

## 2022-03-19 MED ORDER — PREDNISONE 20 MG PO TABS: 60.0000 mg | ORAL_TABLET | Freq: Every day | ORAL | 0 refills | Status: AC

## 2022-03-19 NOTE — ED Provider Notes (Signed)
Signout from Dr. Karle Starch.  88 year old female admitted and boarding waiting for bed. Physical Exam  BP (!) 147/57   Pulse 88   Temp 98.3 F (36.8 C) (Oral)   Resp (!) 25   Ht 4\' 8"  (1.422 m)   Wt 53.1 kg   SpO2 95%   BMI 26.23 kg/m   Physical Exam  Procedures  Procedures  ED Course / MDM    Medical Decision Making Amount and/or Complexity of Data Reviewed Labs: ordered. Radiology: ordered.  Risk Prescription drug management. Decision regarding hospitalization.   8am Patient's daughter said her breathing is better and wants to know if they can decline admission and just go home.  Reviewed the prior note.  She has been off oxygen all night.  It sounds like she had a tough night with some sundowning, is getting rest now.  Offered to the daughter that we will let her rest a little bit and when she is awake we can ambulate her and see how she is doing with her breathing.  10 AM.  Patient awake and alert.  She feels her breathing is better and would like to go home.  Daughter also would like her to go home and she is a retired [employer] scientist.  She said she has an inhaler at home and can follow-up with primary care doctor.  Will cover her with steroids and antibiotics.  She understands there is a risk for worsening of condition and she understands that she can return if any problems.       Hayden Rasmussen, MD 03/19/22 1725

## 2022-03-19 NOTE — ED Notes (Signed)
Pts daughter aware that pt is being  admitted , pt asking if her moms breathing is better could she please take her home , I said I would speak to dr Melina Copa

## 2022-03-19 NOTE — Discharge Instructions (Signed)
You were seen in the emergency department for cough shortness of breath.  Your chest x-ray did not show any obvious pneumonia.  You were waiting for admission to the hospital but felt you would be better at home.  You are not requiring additional oxygen.  We are prescribing you some antibiotics and steroids.  Please continue your breathing treatments and follow-up with your primary care doctor.  Return to the emergency department if any worsening or concerning symptoms.

## 2022-03-19 NOTE — ED Notes (Signed)
Dr Melina Copa into speak to daughter

## 2022-03-19 NOTE — ED Notes (Signed)
Patient refused vital signs . Patient is Alert , breathing spontaneously to room air . No acute distress noted . Continuing monitoring .

## 2022-03-19 NOTE — ED Notes (Signed)
Spoke to  pts daughter. Pt was sleeping. Daughter up to get coffee. Daughter states that pt  is worse than before because she is out of her element , states pt just went to sleep, daughter has been with pt all night, pt had refused vitals anything nurse wanted to do
